# Patient Record
Sex: Female | Born: 1950 | Race: White | Hispanic: No | Smoking: Never smoker
Health system: Southern US, Community
[De-identification: ages and names within clinical notes are randomized; demographics above are authoritative.]

## PROBLEM LIST (undated history)

## (undated) DIAGNOSIS — M1990 Unspecified osteoarthritis, unspecified site: Secondary | ICD-10-CM

## (undated) DIAGNOSIS — F32A Depression, unspecified: Secondary | ICD-10-CM

## (undated) DIAGNOSIS — F329 Major depressive disorder, single episode, unspecified: Secondary | ICD-10-CM

## (undated) DIAGNOSIS — K219 Gastro-esophageal reflux disease without esophagitis: Secondary | ICD-10-CM

## (undated) HISTORY — PX: ABDOMINAL HYSTERECTOMY: SHX81

## (undated) HISTORY — PX: BACK SURGERY: SHX140

---

## 2016-07-11 ENCOUNTER — Emergency Department (HOSPITAL_BASED_OUTPATIENT_CLINIC_OR_DEPARTMENT_OTHER): Payer: Medicare (Managed Care)

## 2016-07-11 ENCOUNTER — Encounter (HOSPITAL_BASED_OUTPATIENT_CLINIC_OR_DEPARTMENT_OTHER): Payer: Self-pay | Admitting: *Deleted

## 2016-07-11 ENCOUNTER — Emergency Department (HOSPITAL_BASED_OUTPATIENT_CLINIC_OR_DEPARTMENT_OTHER)
Admission: EM | Admit: 2016-07-11 | Discharge: 2016-07-11 | Disposition: A | Discharge status: Home / Self Care | Payer: Medicare (Managed Care) | Attending: Emergency Medicine | Admitting: Emergency Medicine

## 2016-07-11 DIAGNOSIS — M109 Gout, unspecified: Secondary | ICD-10-CM

## 2016-07-11 DIAGNOSIS — M10039 Idiopathic gout, unspecified wrist: Secondary | ICD-10-CM | POA: Insufficient documentation

## 2016-07-11 DIAGNOSIS — M10042 Idiopathic gout, left hand: Secondary | ICD-10-CM | POA: Diagnosis not present

## 2016-07-11 DIAGNOSIS — M10041 Idiopathic gout, right hand: Secondary | ICD-10-CM | POA: Insufficient documentation

## 2016-07-11 DIAGNOSIS — M79643 Pain in unspecified hand: Secondary | ICD-10-CM | POA: Diagnosis present

## 2016-07-11 DIAGNOSIS — Z79899 Other long term (current) drug therapy: Secondary | ICD-10-CM | POA: Insufficient documentation

## 2016-07-11 DIAGNOSIS — M10062 Idiopathic gout, left knee: Secondary | ICD-10-CM | POA: Insufficient documentation

## 2016-07-11 HISTORY — DX: Major depressive disorder, single episode, unspecified: F32.9

## 2016-07-11 HISTORY — DX: Gastro-esophageal reflux disease without esophagitis: K21.9

## 2016-07-11 HISTORY — DX: Depression, unspecified: F32.A

## 2016-07-11 HISTORY — DX: Unspecified osteoarthritis, unspecified site: M19.90

## 2016-07-11 MED ORDER — PREDNISONE 20 MG PO TABS: ORAL_TABLET | ORAL | 0 refills | Status: AC

## 2016-07-11 MED ORDER — MORPHINE SULFATE (PF) 4 MG/ML IV SOLN
6.0000 mg | Freq: Once | INTRAVENOUS | Status: AC
  Administered 2016-07-11: 6 mg via INTRAMUSCULAR
  Filled 2016-07-11: qty 2

## 2016-07-11 MED ORDER — NAPROXEN 500 MG PO TABS: 500.0000 mg | ORAL_TABLET | Freq: Two times a day (BID) | ORAL | 0 refills | Status: AC

## 2016-07-11 MED ORDER — KETOROLAC TROMETHAMINE 60 MG/2ML IM SOLN
60.0000 mg | Freq: Once | INTRAMUSCULAR | Status: AC
  Administered 2016-07-11: 60 mg via INTRAMUSCULAR
  Filled 2016-07-11: qty 2

## 2016-07-11 NOTE — ED Notes (Signed)
PA at bedside.

## 2016-07-11 NOTE — ED Triage Notes (Signed)
Pt c/o all extr pain x 2 days

## 2016-07-11 NOTE — ED Provider Notes (Signed)
[facility] Note   CSN: [id number] Arrival date & time: 07/11/16  [id number]  By signing my name below, I, Sydney Hayes, attest that this documentation has been prepared under the direction and in the presence of  Everlene FarrierWilliam Pari Lombard, PA-C. Electronically Signed: Christy SartoriusAnastasia Hayes, ED Scribe. 07/11/16. 6:51 PM.  History   Chief Complaint Chief Complaint  Patient presents with  . Pain   The history is provided by the patient. No language interpreter was used.     HPI Comments:  Sydney Hayes is a 65 y.o. female  who presents to the Emergency Department complaining of pain and swelling in her hands starting today at the pool.  She took tylenol 3-4 hours ago without relief.  Patient denies history of gout flare previously. She does report family history of gout. [place] in [place], but states he wasn't helping her.  Patient does drink alcohol. She denies fevers, muscle aches, body aches, numbness or tingling.  She also denies injury.    Past Medical History:  Diagnosis Date  . Arthritis   . Depression   . GERD (gastroesophageal reflux disease)     There are no active problems to display for this patient.   Past Surgical History:  Procedure Laterality Date  . ABDOMINAL HYSTERECTOMY    . BACK SURGERY      OB History    No data available       Home Medications    Prior to Admission medications   Medication Sig Start Date End Date Taking? Authorizing Provider  amLODipine (NORVASC) 5 MG tablet Take 5 mg by mouth daily.   Yes Historical Provider, MD  celecoxib (CELEBREX) 50 MG capsule Take 50 mg by mouth 2 (two) times daily.   Yes Historical Provider, MD  estradiol (ESTRACE) 1 MG tablet Take 1 mg by mouth daily.   Yes Historical Provider, MD  gabapentin (NEURONTIN) 100 MG capsule Take 300 mg by mouth 3 (three) times daily.    Yes Historical Provider, MD  pantoprazole (PROTONIX) 40 MG tablet Take 40 mg by mouth daily.   Yes  Historical Provider, MD  sertraline (ZOLOFT) 100 MG tablet Take 100 mg by mouth daily.   Yes Historical Provider, MD  tiZANidine (ZANAFLEX) 4 MG tablet Take 4 mg by mouth every 6 (six) hours as needed for muscle spasms.   Yes Historical Provider, MD  naproxen (NAPROSYN) 500 MG tablet Take 1 tablet (500 mg total) by mouth 2 (two) times daily with a meal. 07/11/16   Everlene FarrierWilliam Darlina Mccaughey, PA-C  predniSONE (DELTASONE) 20 MG tablet Take 3 tablets (60 mg total) daily for 3 days, then 2 tablets daily (40 mg total) for 3 days, then 1 tablet daily (20 mg total) for 3 days, then 1/2 tablet (10 mg total) daily for 2 days. 07/11/16   Everlene FarrierWilliam Debria Broecker, PA-C    Family History No family history on file.  Social History Social History  Substance Use Topics  . Smoking status: Never Smoker  . Smokeless tobacco: Not on file  . Alcohol use No     Allergies   Review of patient's allergies indicates not on file.   Review of Systems Review of Systems  Constitutional: Negative for fever.  Gastrointestinal: Negative for nausea and vomiting.  Musculoskeletal: Positive for arthralgias (bilateral wrists and right side of her right knee) and joint swelling. Negative for back pain, myalgias and neck pain.  Skin: Negative for rash and wound.  Neurological: Negative for weakness,  numbness and headaches.     Physical Exam Updated Vital Signs BP 148/74 (BP Location: Right Arm)   Pulse 80   Temp 97.7 F (36.5 C) (Oral)   Resp 20   Ht 5' (1.524 m)   Wt 83 kg   SpO2 97%   BMI 35.74 kg/m   Physical Exam  Constitutional: She appears well-developed and well-nourished. No distress.  Nontoxic appearing.  HENT:  Head: Normocephalic and atraumatic.  Eyes: Right eye exhibits no discharge. Left eye exhibits no discharge.  Cardiovascular: Normal rate, regular rhythm, normal heart sounds and intact distal pulses.   Pulses:      Radial pulses are 2+ on the right side, and 2+ on the left side.  Bilateral radial pulses  are intact. Good capillary refill to her bilateral distal fingertips.  Pulmonary/Chest: Effort normal. No respiratory distress.  Abdominal: Soft. There is no tenderness.  Musculoskeletal: She exhibits edema and tenderness.  Mild edema and warmth overlying her 3rd, 4th and 5th metacarpals.  Pain with ROM of her bilateral hands. Hand compartments feel soft.  No evidence of septic joint. No other warm or red joints.   Neurological: She is alert. Coordination normal.  Sensation is intact her bilateral distal fingertips.  Skin: Skin is warm and dry. Capillary refill takes less than 2 seconds. No rash noted. She is not diaphoretic. No erythema. No pallor.  Psychiatric: She has a normal mood and affect. Her behavior is normal.  Nursing note and vitals reviewed.    ED Treatments / Results   DIAGNOSTIC STUDIES:  Oxygen Saturation is 99% on RA, nml by my interpretation.    COORDINATION OF CARE:  6:51 PM Discussed treatment plan with pt at bedside and pt agreed to plan.  Labs (all labs ordered are listed, but only abnormal results are displayed) Labs Reviewed - No data to display  EKG  EKG Interpretation None       Radiology Dg Hand Complete Left  Result Date: 07/11/2016 CLINICAL DATA:  Bilateral wrist pain while at water park, redness and swelling. EXAM: RIGHT HAND - COMPLETE 3+ VIEW; LEFT HAND - COMPLETE 3+ VIEW COMPARISON:  None. FINDINGS: RIGHT: There is no evidence of fracture or dislocation. Severe first metacarpal phalangeal joint space narrowing, marginal spurring compatible with osteoarthrosis. No focal bone abnormality. Soft tissues are unremarkable. LEFT: There is no evidence of fracture or dislocation. Multilevel moderate to severe distal phalangeal osteoarthrosis. Erosion of second middle phalanx. Multilevel moderate to severe distal interphalangeal osteoarthrosis. Dorsal wrist soft tissue swelling. Tiny calcifications about the wrist. Severe first metacarpal phalangeal joint  space narrowing, marginal spurring compatible with osteoarthrosis. IMPRESSION: No acute fracture deformity or dislocation. LEFT wrist soft tissue swelling and loose bodies. 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 Erosion RIGHT second middle phalanx which could be degenerative or, secondary to gout. Electronically Signed   By: Awilda Metroourtnay  Bloomer M.D.   On: 07/11/2016 19:46   Dg Hand Complete Right  Result Date: 07/11/2016 CLINICAL DATA:  Bilateral wrist pain while at water park, redness and swelling. EXAM: RIGHT HAND - COMPLETE 3+ VIEW; LEFT HAND - COMPLETE 3+ VIEW COMPARISON:  None. FINDINGS: RIGHT: There is no evidence of fracture or dislocation. Severe first metacarpal phalangeal joint space narrowing, marginal spurring compatible with osteoarthrosis. No focal bone abnormality. Soft tissues are unremarkable. LEFT: There is no evidence of fracture or dislocation. Multilevel moderate to severe distal phalangeal osteoarthrosis. Erosion of second middle phalanx. Multilevel moderate to severe distal interphalangeal osteoarthrosis. Dorsal wrist soft tissue swelling. Tiny  calcifications about the wrist. Severe first metacarpal phalangeal joint space narrowing, marginal spurring compatible with osteoarthrosis. IMPRESSION: No acute fracture deformity or dislocation. LEFT wrist soft tissue swelling and loose bodies. 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 Erosion RIGHT second middle phalanx which could be degenerative or, secondary to gout. Electronically Signed   By: Awilda Metro M.D.   On: 07/11/2016 19:46    Procedures Procedures (including critical care time)  Medications Ordered in ED Medications  ketorolac (TORADOL) injection 60 mg (60 mg Intramuscular Given 07/11/16 1900)  morphine 4 MG/ML injection 6 mg (6 mg Intramuscular Given 07/11/16 2017)     Initial Impression / Assessment and Plan / ED Course  I have reviewed the triage vital signs and the nursing notes.  Pertinent labs & imaging  results that were available during my care of the patient were reviewed by me and considered in my medical decision making (see chart for details).  Clinical Course   Patient presented with atraumatic bilateral hand pain after swimming in the pool today. On exam patient is afebrile nontoxic appearing. She has edema and warmth noted to her bilateral third, fourth and fifth metacarpals. She is neurovascularly intact. Exam seems consistent with gouty arthritis. No evidence of septic joint. X-ray of her bilateral hands show degenerative changes or changes secondary to gout. No evidence of acute fracture deformity or dislocation. Patient received Toradol and IM morphine in the emergency department beginning to relieve her pain. Will discharge with naproxen and a steroid taper for gouty arthritis. I discussed diet instructions. I encouraged her to follow-up with her rheumatologist and with her primary care doctor. I advised the patient to follow-up with their primary care provider this week. I advised the patient to return to the emergency department with new or worsening symptoms or new concerns. The patient verbalized understanding and agreement with plan.    Final Clinical Impressions(s) / ED Diagnoses   Final diagnoses:  Gouty arthritis    New Prescriptions New Prescriptions   NAPROXEN (NAPROSYN) 500 MG TABLET    Take 1 tablet (500 mg total) by mouth 2 (two) times daily with a meal.   PREDNISONE (DELTASONE) 20 MG TABLET    Take 3 tablets (60 mg total) daily for 3 days, then 2 tablets daily (40 mg total) for 3 days, then 1 tablet daily (20 mg total) for 3 days, then 1/2 tablet (10 mg total) daily for 2 days.   I personally performed the services described in this documentation, which was scribed in my presence. The recorded information has been reviewed and is accurate.       Everlene Farrier, PA-C 07/11/16 2052    Jacalyn Lefevre, MD 07/11/16 [id number]

## 2016-07-11 NOTE — Discharge Instructions (Signed)
Please follow-up with your rheumatologist and her primary care doctor.

## 2016-07-11 NOTE — ED Notes (Addendum)
[place] and [place] a rheumatologist. ?dx'd with a connective tissue disease. Today began having increased pain at the pool. C/O pain to both hands and wrists and also to her knees. Arms elevated on pillows.

## 2016-07-11 NOTE — ED Notes (Signed)
Pt given d/c instructions as per chart. Verbalizes understanding. No questions. Rx x 2. 

## 2017-06-28 IMAGING — DX DG HAND COMPLETE 3+V*R*
3 series · 3 of 3 positions shown · non-contrast
Comparison: None.

CLINICAL DATA: Bilateral wrist pain while at water park, redness
and swelling.

EXAM:
RIGHT HAND - COMPLETE 3+ VIEW; LEFT HAND - COMPLETE 3+ VIEW

[hand pa]
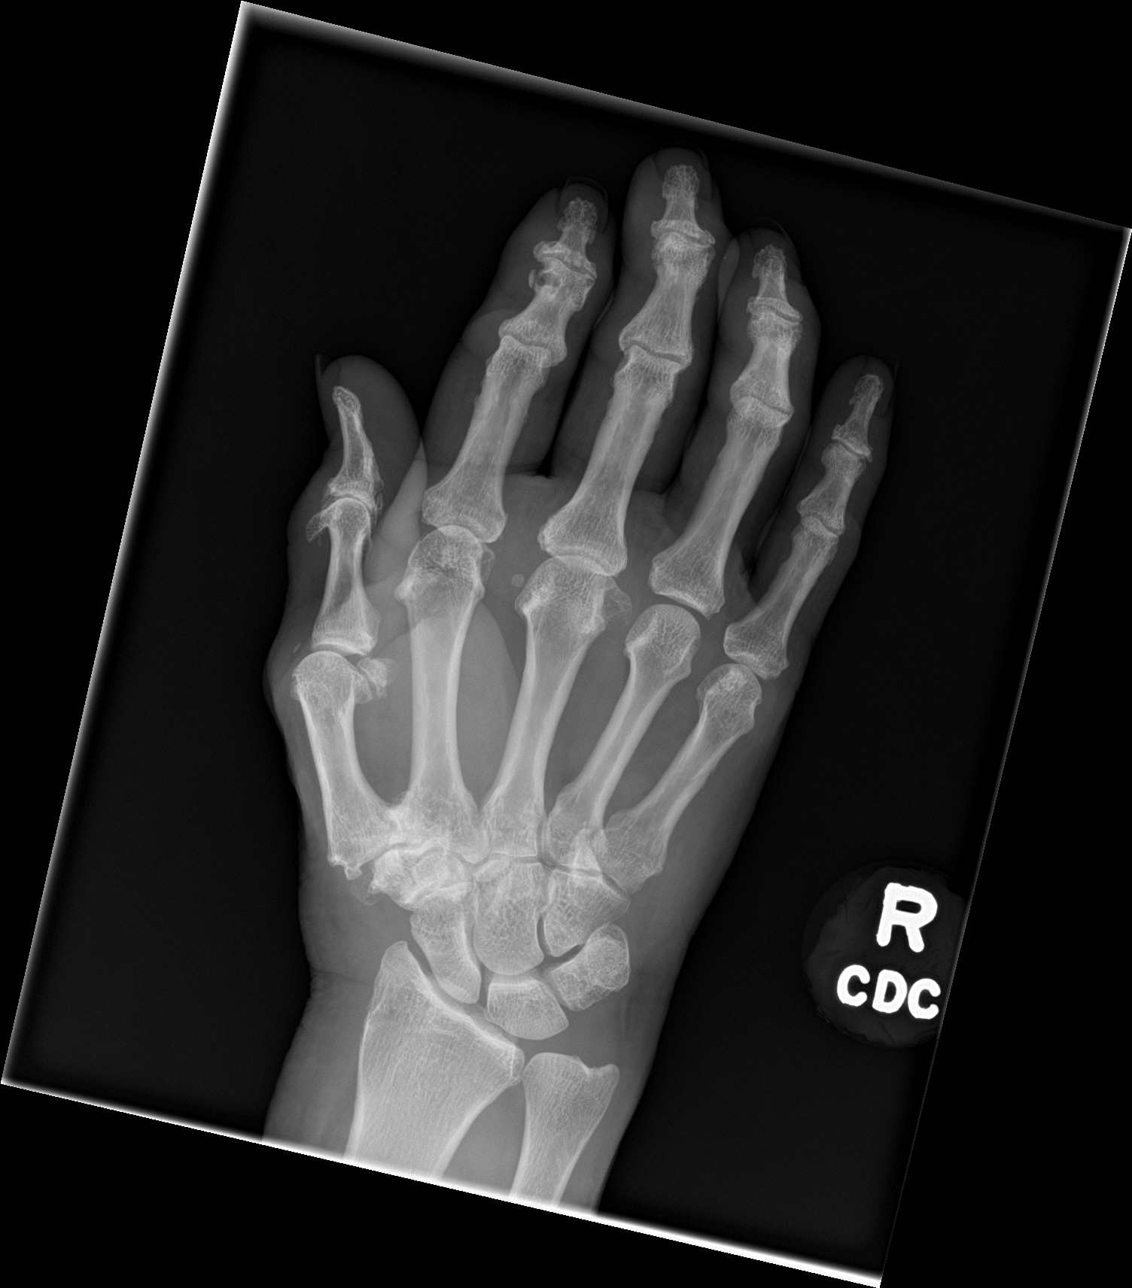

[hand obl]
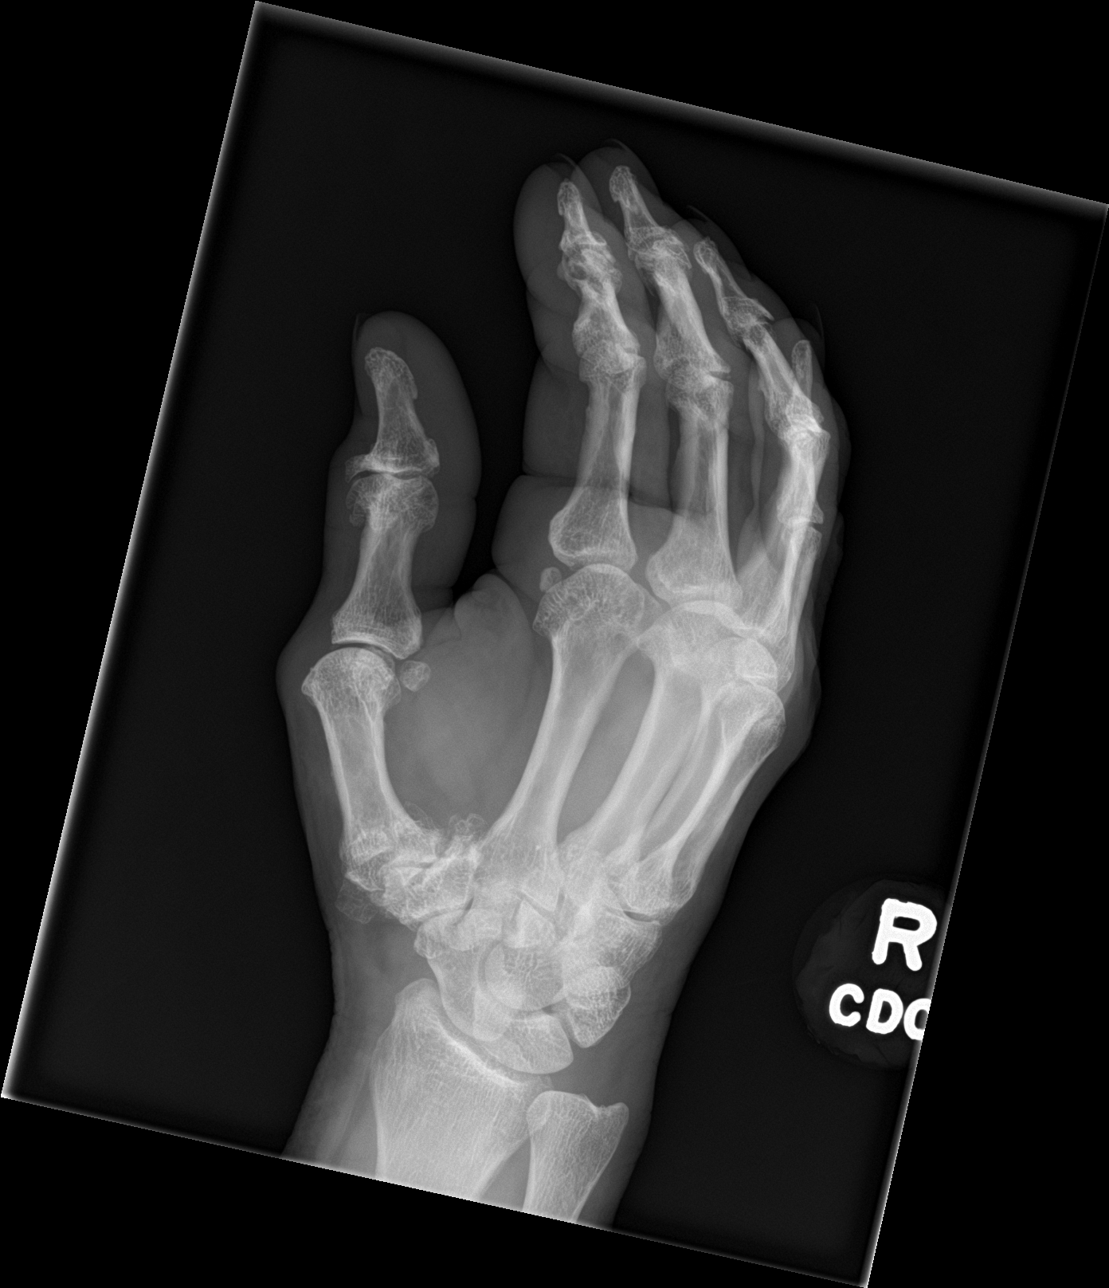

[hand lat]
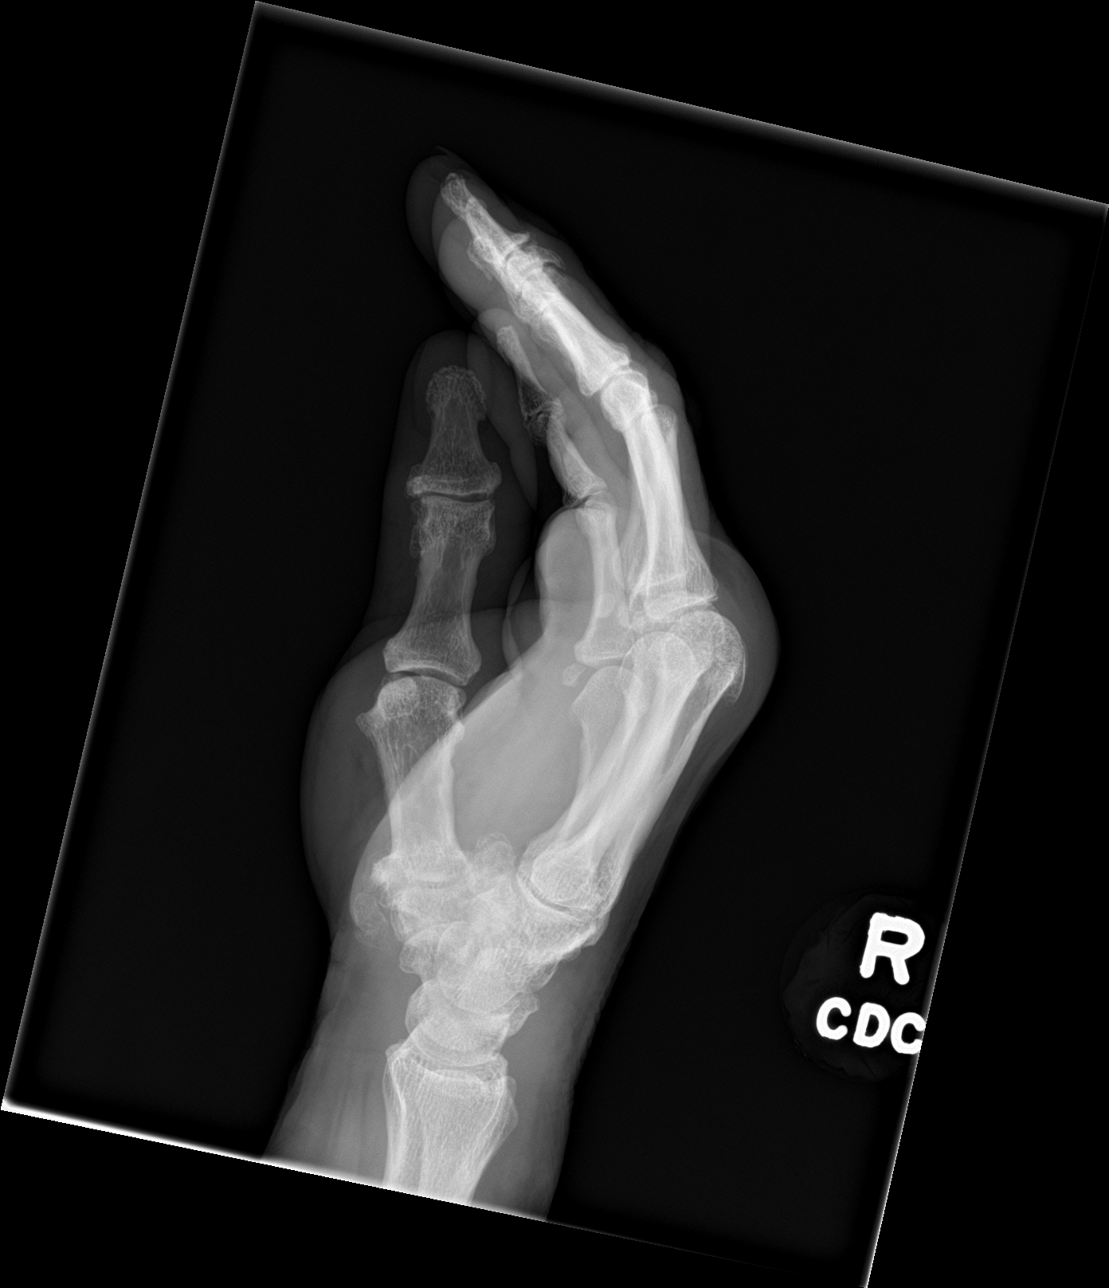

[3 of 3 positions shown; findings below may reference images not displayed]

FINDINGS: RIGHT: There is no evidence of fracture or dislocation. Severe first
metacarpal phalangeal joint space narrowing, marginal spurring
compatible with osteoarthrosis. No focal bone abnormality. Soft
tissues are unremarkable.

LEFT:

There is no evidence of fracture or dislocation. Multilevel moderate
to severe distal phalangeal osteoarthrosis. Erosion of second middle
phalanx. Multilevel moderate to severe distal interphalangeal
osteoarthrosis. Dorsal wrist soft tissue swelling. Tiny
calcifications about the wrist. Severe first metacarpal phalangeal
joint space narrowing, marginal spurring compatible with
osteoarthrosis.
IMPRESSION: No acute fracture deformity or dislocation.

LEFT wrist soft tissue swelling and loose bodies.

Severe bilateral 1st MCP osteoarthrosis.

Erosion RIGHT second middle phalanx which could be degenerative or,
secondary to gout.

## 2017-06-28 IMAGING — DX DG HAND COMPLETE 3+V*L*
3 series · 3 of 3 positions shown · non-contrast
Comparison: None.

CLINICAL DATA: Bilateral wrist pain while at water park, redness
and swelling.

EXAM:
RIGHT HAND - COMPLETE 3+ VIEW; LEFT HAND - COMPLETE 3+ VIEW

[hand pa]
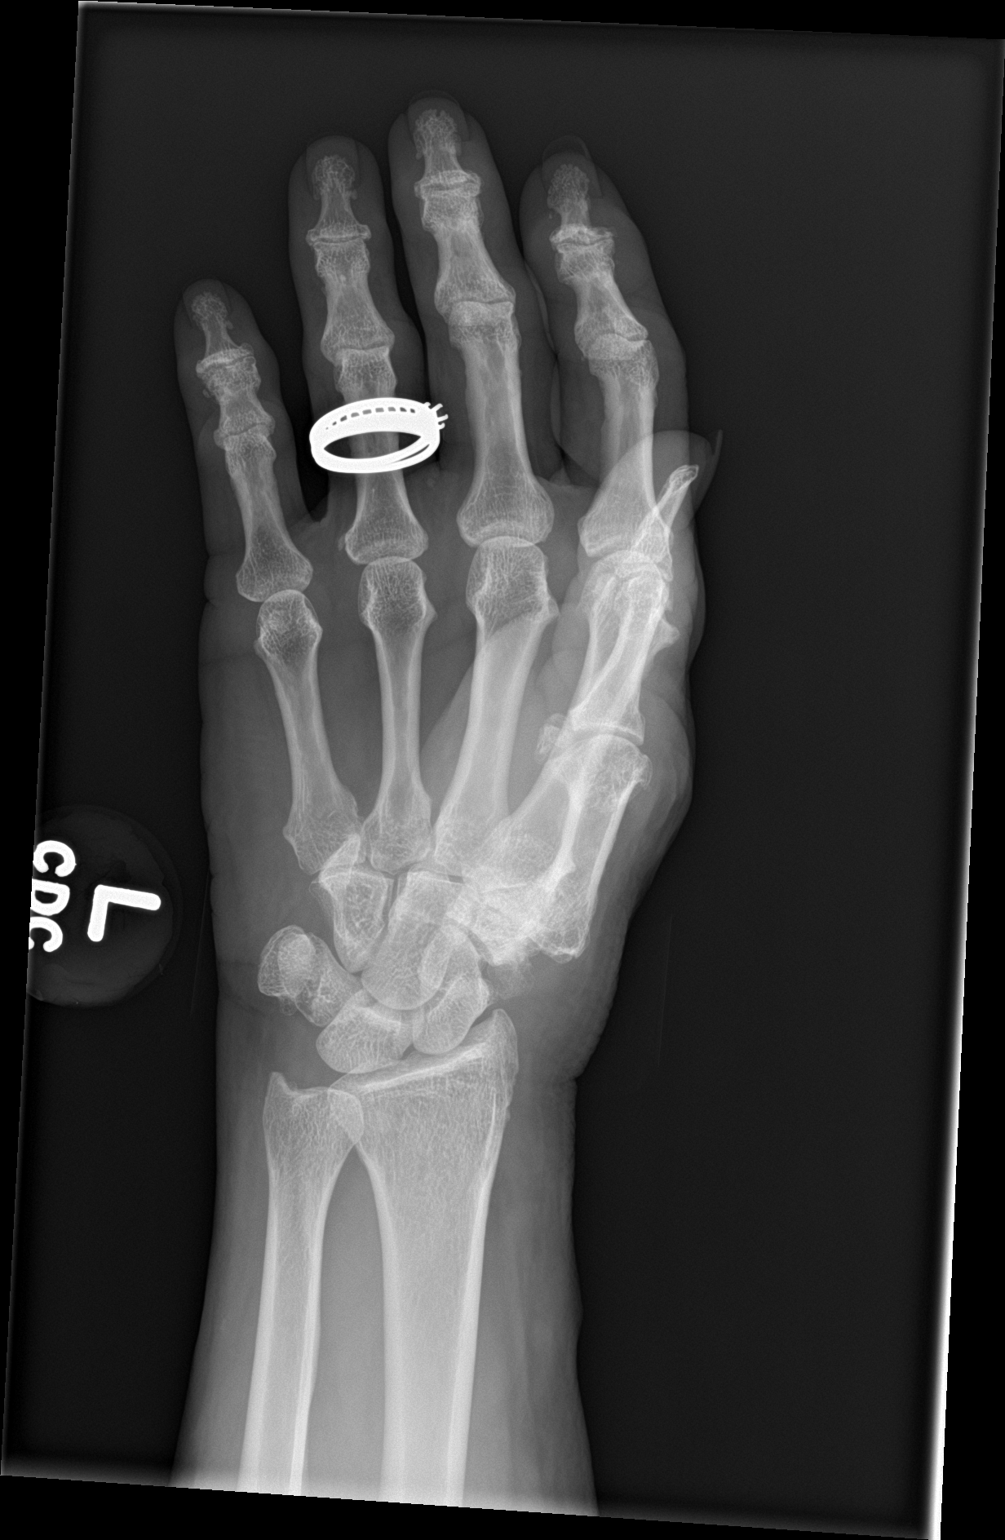

[hand obl]
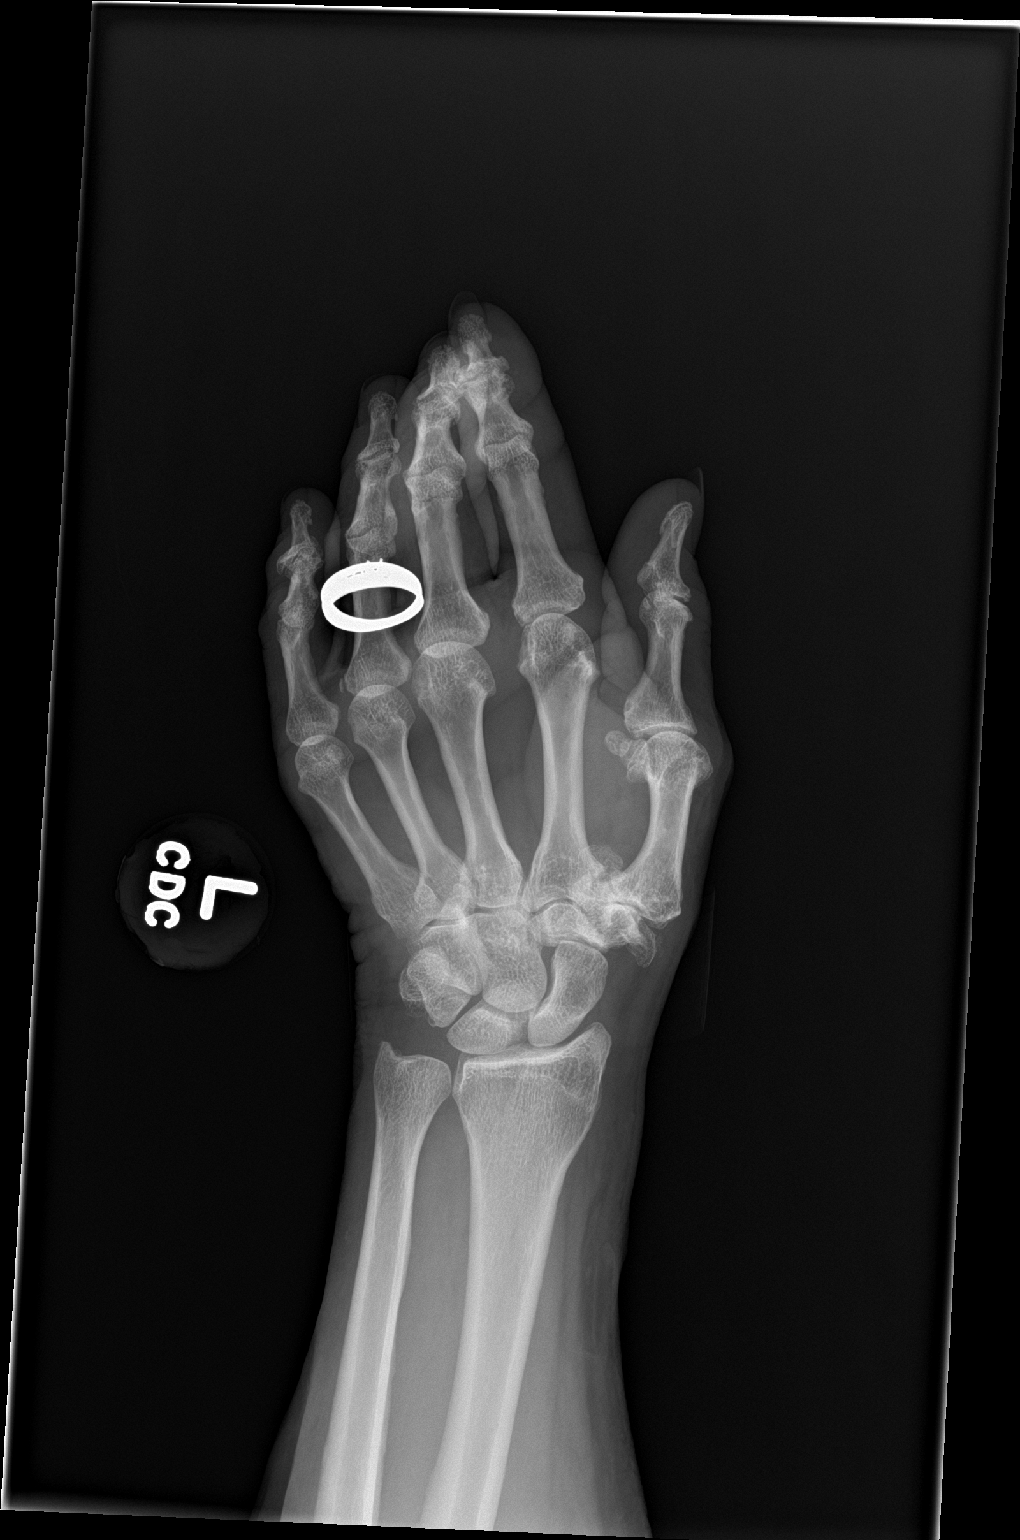

[hand lat]
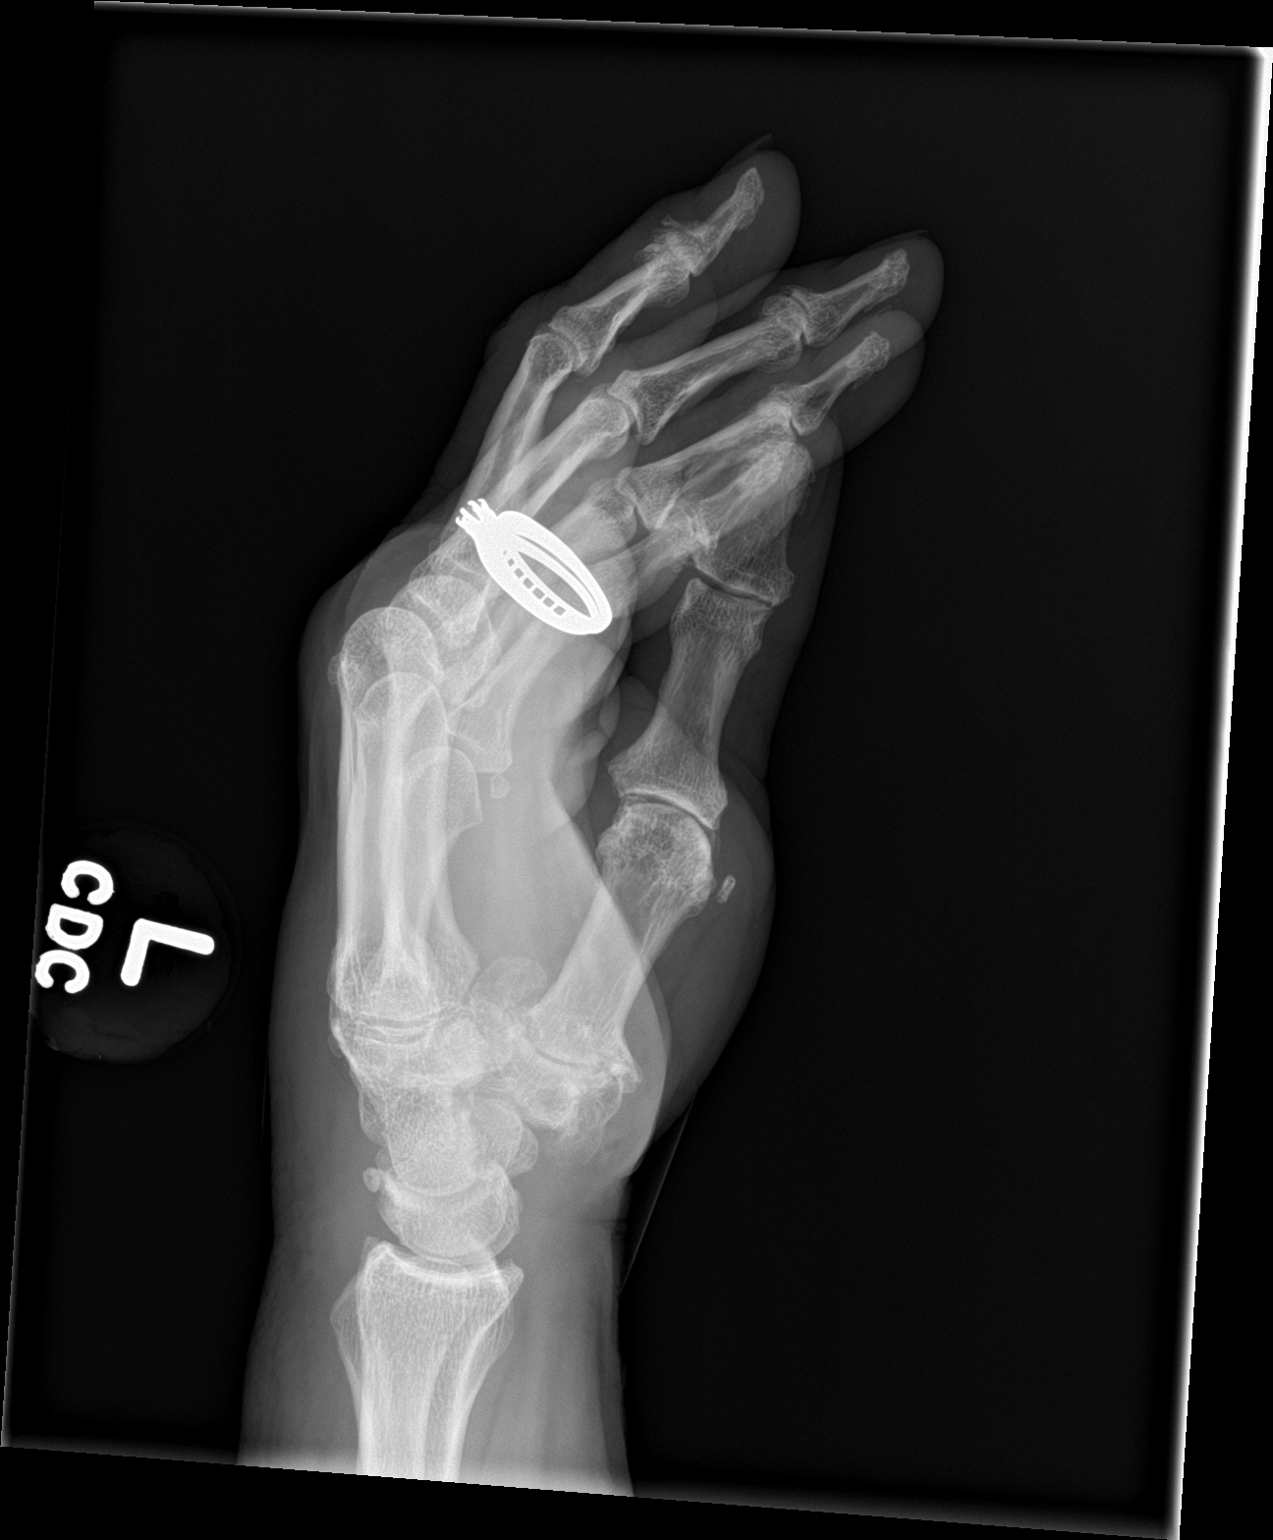

[3 of 3 positions shown; findings below may reference images not displayed]

FINDINGS: RIGHT: There is no evidence of fracture or dislocation. Severe first
metacarpal phalangeal joint space narrowing, marginal spurring
compatible with osteoarthrosis. No focal bone abnormality. Soft
tissues are unremarkable.

LEFT:

There is no evidence of fracture or dislocation. Multilevel moderate
to severe distal phalangeal osteoarthrosis. Erosion of second middle
phalanx. Multilevel moderate to severe distal interphalangeal
osteoarthrosis. Dorsal wrist soft tissue swelling. Tiny
calcifications about the wrist. Severe first metacarpal phalangeal
joint space narrowing, marginal spurring compatible with
osteoarthrosis.
IMPRESSION: No acute fracture deformity or dislocation.

LEFT wrist soft tissue swelling and loose bodies.

Severe bilateral 1st MCP osteoarthrosis.

Erosion RIGHT second middle phalanx which could be degenerative or,
secondary to gout.
